# Patient Record
Sex: Male | Born: 1990 | Race: White | Hispanic: No | Marital: Single | Smoking: Never smoker
Health system: Southern US, Community
[De-identification: ages and names within clinical notes are randomized; demographics above are authoritative.]

---
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
## 2018-12-28 ENCOUNTER — Encounter (HOSPITAL_COMMUNITY): Payer: Self-pay

## 2018-12-28 ENCOUNTER — Emergency Department (HOSPITAL_COMMUNITY)
Admission: EM | Admit: 2018-12-28 | Discharge: 2018-12-28 | Disposition: A | Discharge status: Home / Self Care | Payer: Self-pay | Attending: Emergency Medicine | Admitting: Emergency Medicine

## 2018-12-28 ENCOUNTER — Other Ambulatory Visit: Payer: Self-pay

## 2018-12-28 DIAGNOSIS — R1115 Cyclical vomiting syndrome unrelated to migraine: Secondary | ICD-10-CM | POA: Insufficient documentation

## 2018-12-28 LAB — COMPREHENSIVE METABOLIC PANEL
ALT: 13 U/L (ref 0–44)
AST: 21 U/L (ref 15–41)
Albumin: 3.5 g/dL (ref 3.5–5.0)
Alkaline Phosphatase: 54 U/L (ref 38–126)
Anion gap: 13 (ref 5–15)
BUN: 12 mg/dL (ref 6–20)
CO2: 25 mmol/L (ref 22–32)
Calcium: 9.6 mg/dL (ref 8.9–10.3)
Chloride: 97 mmol/L — ABNORMAL LOW (ref 98–111)
Creatinine, Ser: 0.95 mg/dL (ref 0.61–1.24)
GFR calc Af Amer: >60 mL/min (ref >60)
GFR calc non Af Amer: >60 mL/min (ref >60)
Glucose, Bld: 126 mg/dL — ABNORMAL HIGH (ref 70–99)
Potassium: 3.7 mmol/L (ref 3.5–5.1)
Sodium: 135 mmol/L (ref 135–145)
Total Bilirubin: 1 mg/dL (ref 0.3–1.2)
Total Protein: 7.7 g/dL (ref 6.5–8.1)

## 2018-12-28 LAB — URINALYSIS, ROUTINE W REFLEX MICROSCOPIC
Bilirubin Urine: NEGATIVE
Glucose, UA: 50 mg/dL — AB
Hgb urine dipstick: NEGATIVE
Ketones, ur: 80 mg/dL — AB
Leukocytes,Ua: NEGATIVE
Nitrite: NEGATIVE
Protein, ur: 100 mg/dL — AB
Specific Gravity, Urine: 1.039 — ABNORMAL HIGH (ref 1.005–1.030)
pH: 5 (ref 5.0–8.0)

## 2018-12-28 LAB — CBC
HCT: 40.1 % (ref 39.0–52.0)
Hemoglobin: 13.8 g/dL (ref 13.0–17.0)
MCH: 30.1 pg (ref 26.0–34.0)
MCHC: 34.4 g/dL (ref 30.0–36.0)
MCV: 87.4 fL (ref 80.0–100.0)
Platelets: 343 10*3/uL (ref 150–400)
RBC: 4.59 MIL/uL (ref 4.22–5.81)
RDW: 12.1 % (ref 11.5–15.5)
WBC: 11.6 10*3/uL — ABNORMAL HIGH (ref 4.0–10.5)
nRBC: 0 % (ref 0.0–0.2)

## 2018-12-28 LAB — LIPASE, BLOOD: Lipase: 22 U/L (ref 11–51)

## 2018-12-28 MED ORDER — SODIUM CHLORIDE 0.9% FLUSH: 3.0000 mL | Freq: Once | INTRAVENOUS | Status: DC

## 2018-12-28 MED ORDER — HALOPERIDOL LACTATE 5 MG/ML IJ SOLN
2.0000 mg | Freq: Once | INTRAMUSCULAR | Status: AC
  Administered 2018-12-28: 2 mg via INTRAVENOUS
  Filled 2018-12-28: qty 1

## 2018-12-28 MED ORDER — ONDANSETRON 4 MG PO TBDP: 4.0000 mg | ORAL_TABLET | Freq: Three times a day (TID) | ORAL | 0 refills | Status: DC | PRN

## 2018-12-28 MED ORDER — LACTATED RINGERS IV BOLUS
1000.0000 mL | Freq: Once | INTRAVENOUS | Status: AC
  Administered 2018-12-28: 1000 mL via INTRAVENOUS

## 2018-12-28 MED ORDER — DIPHENHYDRAMINE HCL 50 MG/ML IJ SOLN
25.0000 mg | Freq: Once | INTRAMUSCULAR | Status: AC
  Administered 2018-12-28: 25 mg via INTRAVENOUS
  Filled 2018-12-28: qty 1

## 2018-12-28 NOTE — ED Notes (Signed)
Patient verbalizes understanding of discharge instructions. Opportunity for questioning and answers were provided. Armband removed by staff, pt discharged from ED.  

## 2018-12-28 NOTE — ED Triage Notes (Signed)
Patient complains of intermittent abdominal cramping with vomiting since Friday. Has been taking zofran with no relief. Denies diarrhea. States he is now vomiting bile

## 2018-12-28 NOTE — ED Provider Notes (Signed)
[facility] EMERGENCY DEPARTMENT Provider Note   CSN: [id number] Arrival date & time: 12/28/18  1657    History   Chief Complaint Chief Complaint  Patient presents with  . vomiting x 3 days    HPI Scott Santiago is a 28 y.o. male.  With past medical history of daily marijuana use who comes in with 3 days of nausea vomiting and epigastric pain.  Patient states he went to an urgent care 3 days before the given Zofran initially helped but is no longer working.  States he has had multiple episodes like this in the past.  States this has been going on for years but has increased in frequency over the past year.  States he has noticed that when he increases his marijuana use he is more likely to get these episodes.  Denies any fever, urinary symptoms, diarrhea or constipation.     HPI  History reviewed. No pertinent past medical history.  There are no active problems to display for this patient.   History reviewed. No pertinent surgical history.      Home Medications    Prior to Admission medications   Medication Sig Start Date End Date Taking? Authorizing Provider  ondansetron (ZOFRAN ODT) 4 MG disintegrating tablet Take 1 tablet (4 mg total) by mouth every 8 (eight) hours as needed for nausea or vomiting. 12/28/18   Dicky Doe, MD    Family History No family history on file.  Social History Social History   Tobacco Use  . Smoking status: Not on file  Substance Use Topics  . Alcohol use: Not on file  . Drug use: Not on file     Allergies   Patient has no known allergies.   Review of Systems Review of Systems  Constitutional: Positive for activity change and appetite change. Negative for chills, diaphoresis and fever.  HENT: Negative for ear pain.   Eyes: Negative for pain and visual disturbance.  Respiratory: Negative for cough and shortness of breath.   Cardiovascular: Negative for chest pain and palpitations.  Gastrointestinal: Positive  for abdominal pain, nausea and vomiting. Negative for constipation and diarrhea.  Genitourinary: Negative for dysuria.  Musculoskeletal: Negative for arthralgias and back pain.  Skin: Negative for color change and rash.  All other systems reviewed and are negative.    Physical Exam Updated Vital Signs BP 120/75   Pulse 71   Temp 98.3 F (36.8 C) (Oral)   Resp (!) 24   Ht 6\' 2"  (1.88 m)   Wt 77.1 kg   SpO2 97%   BMI 21.83 kg/m   Physical Exam Vitals signs and nursing note reviewed.  Constitutional:      General: He is not in acute distress.    Appearance: He is well-developed. He is ill-appearing.  HENT:     Head: Normocephalic and atraumatic.  Eyes:     Conjunctiva/sclera: Conjunctivae normal.  Neck:     Musculoskeletal: Neck supple.  Cardiovascular:     Rate and Rhythm: Normal rate and regular rhythm.     Heart sounds: No murmur.  Pulmonary:     Effort: Pulmonary effort is normal. No respiratory distress.     Breath sounds: Normal breath sounds.  Abdominal:     Palpations: Abdomen is soft.     Tenderness: There is abdominal tenderness (epigastric).  Musculoskeletal:     Right lower leg: No edema.     Left lower leg: No edema.  Skin:    General: Skin is  warm and dry.  Neurological:     Mental Status: He is alert and oriented to person, place, and time.     Gait: Gait normal.      ED Treatments / Results  Labs (all labs ordered are listed, but only abnormal results are displayed) Labs Reviewed  COMPREHENSIVE METABOLIC PANEL - Abnormal; Notable for the following components:      Result Value   Chloride 97 (*)    Glucose, Bld 126 (*)    All other components within normal limits  CBC - Abnormal; Notable for the following components:   WBC 11.6 (*)    All other components within normal limits  URINALYSIS, ROUTINE W REFLEX MICROSCOPIC - Abnormal; Notable for the following components:   Color, Urine AMBER (*)    Specific Gravity, Urine 1.039 (*)     Glucose, UA 50 (*)    Ketones, ur 80 (*)    Protein, ur 100 (*)    Bacteria, UA RARE (*)    All other components within normal limits  LIPASE, BLOOD    EKG EKG Interpretation  Date/Time:  Monday Dec 28 2018 20:25:40 EDT Ventricular Rate:  71 PR Interval:    QRS Duration: 110 QT Interval:  376 QTC Calculation: 409 R Axis:   97 Text Interpretation:  Sinus rhythm Short PR interval Consider right ventricular hypertrophy No old tracing to compare Confirmed by Pricilla Loveless [phone]) on 12/28/2018 9:10:39 PM   Radiology No results found.  Procedures Procedures (including critical care time)  Medications Ordered in ED Medications  sodium chloride flush (NS) 0.9 % injection 3 mL (has no administration in time range)  lactated ringers bolus 1,000 mL (0 mLs Intravenous Stopped 12/28/18 2151)  haloperidol lactate (HALDOL) injection 2 mg (2 mg Intravenous Given 12/28/18 2048)  diphenhydrAMINE (BENADRYL) injection 25 mg (25 mg Intravenous Given 12/28/18 2045)     Initial Impression / Assessment and Plan / ED Course  I have reviewed the triage vital signs and the nursing notes.  Pertinent labs & imaging results that were available during my care of the patient were reviewed by me and considered in my medical decision making (see chart for details).         Scott Santiago is a 28 y.o. male.  With past medical history of daily marijuana use who comes in with 3 days of nausea vomiting and epigastric pain.  On initial exam patient is ill-appearing but not in acute distress.  Vital signs are stable.  Labs unremarkable with a normal bicarb and lipase except for ketones in the urine.   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  EKG obtained that showed normal QTc.   Patient given 2 mg of Haldol, IV Benadryl, 1 L of LR.  On reevaluation patient states his nausea vomiting abdominal pain is completely relieved.  However he is endorsing some  mild akathisia.  States he feels much better though.  This time patient is stable for discharge home.  Counseled patient on marijuana cessation.  Advised him that if he continues to smoke marijuana he is likely to have return of symptoms.  Patient states understanding.  Patient discharged home in stable condition with a prescription for Zofran.   Final Clinical Impressions(s) / ED Diagnoses   Final diagnoses:  Cyclic vomiting syndrome    ED Discharge Orders         Ordered    ondansetron (ZOFRAN ODT) 4 MG disintegrating tablet  Every 8 hours  PRN     12/28/18 2136           Dicky DoeFord, Kalyan Barabas, MD 12/28/18 [id number]    Pricilla LovelessGoldston, Scott, MD 12/30/18 1309

## 2019-08-02 ENCOUNTER — Ambulatory Visit: Payer: Self-pay | Attending: Internal Medicine

## 2019-09-06 ENCOUNTER — Ambulatory Visit
Admission: EM | Admit: 2019-09-06 | Discharge: 2019-09-06 | Disposition: A | Discharge status: Home / Self Care | Payer: Self-pay | Attending: Physician Assistant | Admitting: Physician Assistant

## 2019-09-06 DIAGNOSIS — Z20822 Contact with and (suspected) exposure to covid-19: Secondary | ICD-10-CM

## 2019-09-06 DIAGNOSIS — J029 Acute pharyngitis, unspecified: Secondary | ICD-10-CM

## 2019-09-06 DIAGNOSIS — R52 Pain, unspecified: Secondary | ICD-10-CM

## 2019-09-06 DIAGNOSIS — R6883 Chills (without fever): Secondary | ICD-10-CM

## 2019-09-06 LAB — POCT RAPID STREP A (OFFICE): Rapid Strep A Screen: NEGATIVE

## 2019-09-06 MED ORDER — AZITHROMYCIN 500 MG PO TABS: 500.0000 mg | ORAL_TABLET | Freq: Every day | ORAL | 0 refills | Status: AC

## 2019-09-06 NOTE — ED Provider Notes (Signed)
[facility]    CSN: [id number] Arrival date & time: 09/06/19  8588      History   Chief Complaint Chief Complaint  Patient presents with  . Sore Throat    HPI Fount Bahe is a 29 y.o. male.   29 year old male comes in for 3 day of URI symptoms. Sore throat, swollen tonsil, body aches, chills. States taste is off. Denies fever. Denies abdominal pain, nausea, vomiting, diarrhea. Denies shortness of breath, loss of taste/smell. No sick contact. Gurgled salt water with some relief.      History reviewed. No pertinent past medical history.  There are no problems to display for this patient.   History reviewed. No pertinent surgical history.     Home Medications    Prior to Admission medications   Medication Sig Start Date End Date Taking? Authorizing Provider  azithromycin (ZITHROMAX) 500 MG tablet Take 1 tablet (500 mg total) by mouth daily for 5 days. Take first 2 tablets together, then 1 every day until finished. 09/06/19 09/11/19  Belinda Fisher, PA-C    Family History History reviewed. No pertinent family history.  Social History Social History   Tobacco Use  . Smoking status: Never Smoker  . Smokeless tobacco: Never Used  Substance Use Topics  . Alcohol use: Yes    Comment: occ  . Drug use: Never     Allergies   Amoxicillin   Review of Systems Review of Systems  Reason unable to perform ROS: See HPI as above.     Physical Exam Triage Vital Signs ED Triage Vitals [09/06/19 0950]  Enc Vitals Group     BP 134/85     Pulse Rate (!) 116     Resp 16     Temp 98.7 F (37.1 C)     Temp Source Oral     SpO2 97 %     Weight      Height      Head Circumference      Peak Flow      Pain Score 3     Pain Loc      Pain Edu?      Excl. in GC?    No data found.  Updated Vital Signs BP 134/85 (BP Location: Left Arm)   Pulse (!) 116   Temp 98.7 F (37.1 C) (Oral)   Resp 16   SpO2 97%   Physical Exam Constitutional:      General: He  is not in acute distress.    Appearance: Normal appearance. He is not ill-appearing, toxic-appearing or diaphoretic.  HENT:     Head: Normocephalic and atraumatic.     Mouth/Throat:     Mouth: Mucous membranes are moist.     Pharynx: Oropharynx is clear. Uvula midline.     Tonsils: 2+ on the right. 1+ on the left.     Comments: Exudates to the right tonsil Cardiovascular:     Rate and Rhythm: Normal rate and regular rhythm.     Heart sounds: Normal heart sounds. No murmur. No friction rub. No gallop.   Pulmonary:     Effort: Pulmonary effort is normal. No accessory muscle usage, prolonged expiration, respiratory distress or retractions.     Comments: Lungs clear to auscultation without adventitious lung sounds. Musculoskeletal:     Cervical back: Normal range of motion and neck supple.  Neurological:     General: No focal deficit present.     Mental Status: He is alert  and oriented to person, place, and time.      UC Treatments / Results  Labs (all labs ordered are listed, but only abnormal results are displayed) Labs Reviewed  CULTURE, GROUP A STREP ([place])  NOVEL CORONAVIRUS, NAA  POCT RAPID STREP A (OFFICE)    EKG   Radiology No results found.  Procedures Procedures (including critical care time)  Medications Ordered in UC Medications - No data to display  Initial Impression / Assessment and Plan / UC Course  I have reviewed the triage vital signs and the nursing notes.  Pertinent labs & imaging results that were available during my care of the patient were reviewed by me and considered in my medical decision making (see chart for details).     Rapid strep negative.  However, given history and exam, will cover for tonsillitis with azithromycin. Discussed cannot rule out COVID, will have patient quarantine while pending COVID results.  Other symptomatic treatment discussed.  Return precautions given.  Patient expresses understanding and agrees to plan.  Final  Clinical Impressions(s) / UC Diagnoses   Final diagnoses:  Sore throat  Body aches  Chills   ED Prescriptions    Medication Sig Dispense Auth. Provider   azithromycin (ZITHROMAX) 500 MG tablet Take 1 tablet (500 mg total) by mouth daily for 5 days. Take first 2 tablets together, then 1 every day until finished. 5 tablet Ok Edwards, PA-C     PDMP not reviewed this encounter.   Ok Edwards, PA-C 09/06/19 1020

## 2019-09-06 NOTE — Discharge Instructions (Signed)
Rapid strep negative. However, given your exam, will cover you empirically for bacterial infection with azithromycin. As discussed, symptoms can still be due to viral illness/drainage to the throat. COVID testing ordered. Please quarantine until testing results return. Monitor for any worsening of symptoms, swelling of the throat, trouble breathing, trouble swallowing, leaning forward to breath, drooling, go to the emergency department for further evaluation needed.

## 2019-09-06 NOTE — ED Triage Notes (Signed)
Pt c/o sore throat with swollen tonsils and white patches since saturday

## 2019-09-07 LAB — NOVEL CORONAVIRUS, NAA: SARS-CoV-2, NAA: NOT DETECTED

## 2019-09-08 ENCOUNTER — Ambulatory Visit
Admission: EM | Admit: 2019-09-08 | Discharge: 2019-09-08 | Disposition: A | Discharge status: Home / Self Care | Payer: Self-pay | Attending: Emergency Medicine | Admitting: Emergency Medicine

## 2019-09-08 DIAGNOSIS — Z113 Encounter for screening for infections with a predominantly sexual mode of transmission: Secondary | ICD-10-CM | POA: Insufficient documentation

## 2019-09-08 DIAGNOSIS — Z7251 High risk heterosexual behavior: Secondary | ICD-10-CM | POA: Insufficient documentation

## 2019-09-08 DIAGNOSIS — Z114 Encounter for screening for human immunodeficiency virus [HIV]: Secondary | ICD-10-CM | POA: Insufficient documentation

## 2019-09-08 DIAGNOSIS — J039 Acute tonsillitis, unspecified: Secondary | ICD-10-CM | POA: Insufficient documentation

## 2019-09-08 LAB — CULTURE, GROUP A STREP (THRC)

## 2019-09-08 MED ORDER — METHYLPREDNISOLONE SODIUM SUCC 125 MG IJ SOLR
125.0000 mg | Freq: Once | INTRAMUSCULAR | Status: AC
  Administered 2019-09-08: 09:00:00 125 mg via INTRAMUSCULAR

## 2019-09-08 MED ORDER — AZITHROMYCIN 250 MG PO TABS
250.0000 mg | ORAL_TABLET | Freq: Once | ORAL | Status: AC
  Administered 2019-09-08: 250 mg via ORAL

## 2019-09-08 MED ORDER — DOXYCYCLINE HYCLATE 100 MG PO CAPS: 100.0000 mg | ORAL_CAPSULE | Freq: Two times a day (BID) | ORAL | 0 refills | Status: DC

## 2019-09-08 NOTE — ED Provider Notes (Signed)
[facility]    CSN: [id number] Arrival date & time: 09/08/19  0850      History   Chief Complaint Chief Complaint  Patient presents with  . Sore Throat    HPI Scott Santiago is a 29 y.o. male sending for persistent sore throat, swollen tonsils.  Patient initially seen 2/1: Negative rapid strep test, culture came back negative.  Covid test still pending.  Patient has been compliant with azithromycin as prescribed and states his left tonsil has not started swelling, right is persisting.  Patient also has headache and fever.  Denies drooling, difficulties breathing, change in voice quality, chest pain, palpitations, shortness of breath.  Patient is currently sexually active with 1 male partner.  Uses condoms, though does perform oral sex without barrier method.  Unsure of current partner's status.  Denies history of HIV, syphilis.   History reviewed. No pertinent past medical history.  There are no problems to display for this patient.   History reviewed. No pertinent surgical history.     Home Medications    Prior to Admission medications   Medication Sig Start Date End Date Taking? Authorizing Provider  azithromycin (ZITHROMAX) 500 MG tablet Take 1 tablet (500 mg total) by mouth daily for 5 days. Take first 2 tablets together, then 1 every day until finished. 09/06/19 09/11/19  Belinda Fisher, PA-C  doxycycline (VIBRAMYCIN) 100 MG capsule Take 1 capsule (100 mg total) by mouth 2 (two) times daily. 09/08/19   Hall-Potvin, [place], PA-C    Family History History reviewed. No pertinent family history.  Social History Social History   Tobacco Use  . Smoking status: Never Smoker  . Smokeless tobacco: Never Used  Substance Use Topics  . Alcohol use: Yes    Comment: occ  . Drug use: Never     Allergies   Amoxicillin   Review of Systems As per HPI   Physical Exam Triage Vital Signs ED Triage Vitals [09/08/19 0902]  Enc Vitals Group     BP (!) 138/96   Pulse Rate (!) 112     Resp 16     Temp 99 F (37.2 C)     Temp Source Oral     SpO2 97 %     Weight      Height      Head Circumference      Peak Flow      Pain Score      Pain Loc      Pain Edu?      Excl. in GC?    No data found.  Updated Vital Signs BP (!) 138/96 (BP Location: Left Arm)   Pulse (!) 112   Temp 99 F (37.2 C) (Oral)   Resp 16   SpO2 97%   Visual Acuity Right Eye Distance:   Left Eye Distance:   Bilateral Distance:    Right Eye Near:   Left Eye Near:    Bilateral Near:     Physical Exam Constitutional:      General: He is not in acute distress.    Appearance: He is well-developed. He is not toxic-appearing or diaphoretic.  HENT:     Head: Normocephalic and atraumatic.     Right Ear: Tympanic membrane, ear canal and external ear normal.     Left Ear: Tympanic membrane, ear canal and external ear normal.     Nose: No nasal deformity, congestion or rhinorrhea.     Mouth/Throat:     Mouth: Mucous membranes  are moist.     Tongue: Tongue does not deviate from midline.     Pharynx: Uvula midline. No uvula swelling.     Tonsils: Tonsillar exudate present. 3+ on the right. 2+ on the left.  Eyes:     General: No scleral icterus.    Conjunctiva/sclera: Conjunctivae normal.     Pupils: Pupils are equal, round, and reactive to light.  Neck:     Thyroid: No thyromegaly.     Comments: R>L LAD Cardiovascular:     Rate and Rhythm: Normal rate and regular rhythm.     Heart sounds: No murmur. No gallop.   Pulmonary:     Effort: Pulmonary effort is normal. No respiratory distress.     Breath sounds: No stridor. No wheezing, rhonchi or rales.  Musculoskeletal:     Cervical back: Normal range of motion and neck supple. No muscular tenderness.  Lymphadenopathy:     Cervical: Cervical adenopathy present.  Skin:    General: Skin is warm.     Capillary Refill: Capillary refill takes less than 2 seconds.     Findings: No erythema or rash.  Neurological:      General: No focal deficit present.     Mental Status: He is alert.  Psychiatric:        Mood and Affect: Mood normal.        Behavior: Behavior normal.      UC Treatments / Results  Labs (all labs ordered are listed, but only abnormal results are displayed) Labs Reviewed  HIV ANTIBODY (ROUTINE TESTING W REFLEX)   Narrative:    Performed at:  [address] [place] [address] [place], [place]  [id number] Lab Director: Jolene Schimke MD, Phone:  [phone]  [facility]   Narrative:    Performed at:  [address], [place], [place]  [id number] Lab Director: Jolene Schimke MD, Phone:  [phone]  CYTOLOGY, (ORAL, ANAL, URETHRAL) ANCILLARY ONLY    EKG   Radiology No results found.  Procedures Procedures (including critical care time)  Medications Ordered in UC Medications  methylPREDNISolone sodium succinate (SOLU-MEDROL) 125 mg/2 mL injection 125 mg (125 mg Intramuscular Given 09/08/19 0927)  azithromycin (ZITHROMAX) tablet 250 mg (250 mg Oral Given 09/08/19 0926)    Initial Impression / Assessment and Plan / UC Course  I have reviewed the triage vital signs and the nursing notes.  Pertinent labs & imaging results that were available during my care of the patient were reviewed by me and considered in my medical decision making (see chart for details).     Patient afebrile, nontoxic in office today.  Temperature 91F without antipyretic/NSAID and airways are not compromised at this time.  Patient states only 5 tabs of azithromycin were dispensed on last prescription: Given 250 mg in office today as patient has not yet taken his daily dose: We will finish 5-day course to completion thereafter.  Will start doxycycline empirically for additional antibiotic coverage/possible gonococcal infection while cytology is pending.  Could also consider clindamycin if cytology is negative and patient develops fevers w/ persistent pain/swelling.  Return precautions  discussed, patient verbalized understanding and is agreeable to plan. Final Clinical Impressions(s) / UC Diagnoses   Final diagnoses:  Screening for human immunodeficiency virus  Screening examination for venereal disease  Acute tonsillitis, unspecified etiology  Unprotected sex     Discharge Instructions     Take antibiotic as prescribed. Please finish your azithromycin dosing tomorrow the next day. You are given  steroid shot today to help with inflammation: May use Tylenol, ice packs as well. STD testing pending: We will call you if positive and needing further treatment. Please go to ER for worsening throat pain, fever, difficulty breathing or swallowing, drooling, chest pain.    ED Prescriptions    Medication Sig Dispense Auth. Provider   doxycycline (VIBRAMYCIN) 100 MG capsule Take 1 capsule (100 mg total) by mouth 2 (two) times daily. 20 capsule Hall-Potvin, [place], PA-C     PDMP not reviewed this encounter.   Hall-Potvin, [place], [place] 09/09/19 1520

## 2019-09-08 NOTE — Discharge Instructions (Addendum)
Take antibiotic as prescribed. Please finish your azithromycin dosing tomorrow the next day. You are given steroid shot today to help with inflammation: May use Tylenol, ice packs as well. STD testing pending: We will call you if positive and needing further treatment. Please go to ER for worsening throat pain, fever, difficulty breathing or swallowing, drooling, chest pain.

## 2019-09-08 NOTE — ED Triage Notes (Signed)
Pt states seen and tx'd on Monday for sore throat and swollen tonsils. States lt tonsil is now swollen with white patches. Pt states now having a headache and fever.

## 2019-09-09 LAB — HIV ANTIBODY (ROUTINE TESTING W REFLEX): HIV Screen 4th Generation wRfx: NONREACTIVE

## 2019-09-09 LAB — CYTOLOGY, (ORAL, ANAL, URETHRAL) ANCILLARY ONLY
Chlamydia: NEGATIVE
Neisseria Gonorrhea: NEGATIVE
Trichomonas: NEGATIVE

## 2019-09-09 LAB — RPR: RPR Ser Ql: NONREACTIVE

## 2019-09-10 ENCOUNTER — Telehealth: Payer: Self-pay | Admitting: Emergency Medicine

## 2019-09-10 NOTE — Telephone Encounter (Signed)
Received a call from patient stating someone tried to call him yesterday and he was returning this call.  This RN looked through chart with APP, and returned patient's call.  Discussed current treatments, available test results, and discussed symptoms.  Patient still complains of some sore throat, but states his other symptoms have improved.  This RN encouraged him to finish antibiotics and continue to monitor until COVID results come back, and recommended ENT follow up if symptoms become concerning in the meantime.  Patient verbalized understanding.

## 2019-12-19 ENCOUNTER — Encounter (HOSPITAL_COMMUNITY): Payer: Self-pay | Admitting: Emergency Medicine

## 2019-12-19 ENCOUNTER — Emergency Department (HOSPITAL_COMMUNITY)
Admission: EM | Admit: 2019-12-19 | Discharge: 2019-12-19 | Disposition: A | Discharge status: Home / Self Care | Payer: Self-pay | Attending: Emergency Medicine | Admitting: Emergency Medicine

## 2019-12-19 ENCOUNTER — Emergency Department (HOSPITAL_COMMUNITY): Payer: Self-pay

## 2019-12-19 DIAGNOSIS — R1084 Generalized abdominal pain: Secondary | ICD-10-CM | POA: Insufficient documentation

## 2019-12-19 DIAGNOSIS — R195 Other fecal abnormalities: Secondary | ICD-10-CM | POA: Insufficient documentation

## 2019-12-19 DIAGNOSIS — Z20822 Contact with and (suspected) exposure to covid-19: Secondary | ICD-10-CM | POA: Insufficient documentation

## 2019-12-19 DIAGNOSIS — J189 Pneumonia, unspecified organism: Secondary | ICD-10-CM | POA: Insufficient documentation

## 2019-12-19 LAB — CBC
HCT: 46.2 % (ref 39.0–52.0)
Hemoglobin: 15.9 g/dL (ref 13.0–17.0)
MCH: 30.3 pg (ref 26.0–34.0)
MCHC: 34.4 g/dL (ref 30.0–36.0)
MCV: 88.2 fL (ref 80.0–100.0)
Platelets: 332 10*3/uL (ref 150–400)
RBC: 5.24 MIL/uL (ref 4.22–5.81)
RDW: 11.3 % — ABNORMAL LOW (ref 11.5–15.5)
WBC: 12.5 10*3/uL — ABNORMAL HIGH (ref 4.0–10.5)
nRBC: 0 % (ref 0.0–0.2)

## 2019-12-19 LAB — COMPREHENSIVE METABOLIC PANEL
ALT: 12 U/L (ref 0–44)
AST: 18 U/L (ref 15–41)
Albumin: 3.9 g/dL (ref 3.5–5.0)
Alkaline Phosphatase: 60 U/L (ref 38–126)
Anion gap: 11 (ref 5–15)
BUN: 6 mg/dL (ref 6–20)
CO2: 25 mmol/L (ref 22–32)
Calcium: 9.6 mg/dL (ref 8.9–10.3)
Chloride: 97 mmol/L — ABNORMAL LOW (ref 98–111)
Creatinine, Ser: 0.9 mg/dL (ref 0.61–1.24)
GFR calc Af Amer: >60 mL/min (ref >60)
GFR calc non Af Amer: >60 mL/min (ref >60)
Glucose, Bld: 122 mg/dL — ABNORMAL HIGH (ref 70–99)
Potassium: 3.9 mmol/L (ref 3.5–5.1)
Sodium: 133 mmol/L — ABNORMAL LOW (ref 135–145)
Total Bilirubin: 1.2 mg/dL (ref 0.3–1.2)
Total Protein: 7.9 g/dL (ref 6.5–8.1)

## 2019-12-19 LAB — POC OCCULT BLOOD, ED: Fecal Occult Bld: NEGATIVE

## 2019-12-19 LAB — URINALYSIS, ROUTINE W REFLEX MICROSCOPIC
Bilirubin Urine: NEGATIVE
Glucose, UA: NEGATIVE mg/dL
Hgb urine dipstick: NEGATIVE
Ketones, ur: 20 mg/dL — AB
Leukocytes,Ua: NEGATIVE
Nitrite: NEGATIVE
Protein, ur: NEGATIVE mg/dL
Specific Gravity, Urine: 1.026 (ref 1.005–1.030)
pH: 7 (ref 5.0–8.0)

## 2019-12-19 LAB — SARS CORONAVIRUS 2 (TAT 6-24 HRS): SARS Coronavirus 2: NEGATIVE

## 2019-12-19 LAB — LIPASE, BLOOD: Lipase: 19 U/L (ref 11–51)

## 2019-12-19 MED ORDER — SODIUM CHLORIDE 0.9% FLUSH: 3.0000 mL | Freq: Once | INTRAVENOUS | Status: DC

## 2019-12-19 MED ORDER — DOXYCYCLINE HYCLATE 100 MG PO CAPS: 100.0000 mg | ORAL_CAPSULE | Freq: Two times a day (BID) | ORAL | 0 refills | Status: DC

## 2019-12-19 MED ORDER — DOXYCYCLINE HYCLATE 100 MG PO TABS
100.0000 mg | ORAL_TABLET | Freq: Once | ORAL | Status: AC
  Administered 2019-12-19: 100 mg via ORAL
  Filled 2019-12-19: qty 1

## 2019-12-19 MED ORDER — IOHEXOL 300 MG/ML  SOLN
100.0000 mL | Freq: Once | INTRAMUSCULAR | Status: AC | PRN
  Administered 2019-12-19: 100 mL via INTRAVENOUS

## 2019-12-19 NOTE — ED Notes (Signed)
Patient verbalizes understanding of discharge instructions. Opportunity for questioning and answers were provided. Armband removed by staff, pt discharged from ED ambulatory.   

## 2019-12-19 NOTE — ED Provider Notes (Signed)
[facility] EMERGENCY DEPARTMENT Provider Note   CSN: [id number] Arrival date & time: 12/19/19  1027     History Chief Complaint  Patient presents with  . Abdominal Pain    Scott Santiago is a 29 y.o. male.  The history is provided by the patient. No language interpreter was used.  Abdominal Pain Pain location:  Generalized Pain quality: aching   Pain radiates to:  Does not radiate Pain severity:  Moderate Onset quality:  Gradual Timing:  Constant Progression:  Worsening Chronicity:  New Context: not sick contacts   Relieved by:  Nothing Worsened by:  Nothing Ineffective treatments:  None tried Risk factors: no recent hospitalization   Pt complains of right sided abdominal pain.  Pt reports he has had some black stool.  Pt reports formed stool. No diarrhea no vomiting.      History reviewed. No pertinent past medical history.  There are no problems to display for this patient.   History reviewed. No pertinent surgical history.     No family history on file.  Social History   Tobacco Use  . Smoking status: Never Smoker  . Smokeless tobacco: Never Used  Substance Use Topics  . Alcohol use: Yes    Comment: occ  . Drug use: Never    Home Medications Prior to Admission medications   Medication Sig Start Date End Date Taking? Authorizing Provider  doxycycline (VIBRAMYCIN) 100 MG capsule Take 1 capsule (100 mg total) by mouth 2 (two) times daily. 12/19/19   Fransico Meadow, PA-C    Allergies    Amoxicillin and Lorabid [loracarbef]  Review of Systems   Review of Systems  Gastrointestinal: Positive for abdominal pain.  All other systems reviewed and are negative.   Physical Exam Updated Vital Signs BP 102/88   Pulse 94   Temp 97.9 F (36.6 C) (Oral)   Resp 16   Ht 6\' 2"  (1.88 m)   Wt 77.1 kg   SpO2 93%   BMI 21.83 kg/m   Physical Exam Vitals and nursing note reviewed.  Constitutional:      Appearance: He is well-developed.   HENT:     Head: Normocephalic and atraumatic.  Eyes:     Conjunctiva/sclera: Conjunctivae normal.  Cardiovascular:     Rate and Rhythm: Normal rate and regular rhythm.     Heart sounds: No murmur.  Pulmonary:     Effort: Pulmonary effort is normal. No respiratory distress.     Breath sounds: Normal breath sounds.  Abdominal:     General: Abdomen is flat.     Palpations: Abdomen is soft.     Tenderness: There is abdominal tenderness.  Musculoskeletal:     Cervical back: Neck supple.  Skin:    General: Skin is warm and dry.  Neurological:     General: No focal deficit present.     Mental Status: He is alert.  Psychiatric:        Mood and Affect: Mood normal.     ED Results / Procedures / Treatments   Labs (all labs ordered are listed, but only abnormal results are displayed) Labs Reviewed  COMPREHENSIVE METABOLIC PANEL - Abnormal; Notable for the following components:      Result Value   Sodium 133 (*)    Chloride 97 (*)    Glucose, Bld 122 (*)    All other components within normal limits  CBC - Abnormal; Notable for the following components:   WBC 12.5 (*)  RDW 11.3 (*)    All other components within normal limits  URINALYSIS, ROUTINE W REFLEX MICROSCOPIC - Abnormal; Notable for the following components:   Ketones, ur 20 (*)    All other components within normal limits  SARS CORONAVIRUS 2 (TAT 6-24 HRS)  LIPASE, BLOOD  POC OCCULT BLOOD, ED    EKG None  Radiology CT ABDOMEN PELVIS W CONTRAST  Result Date: 12/19/2019 CLINICAL DATA:  Pt reports abd pain that is most on the RLQ, also endorses some dark stools over the past few days. Fever of 101 at home. EXAM: CT ABDOMEN AND PELVIS WITH CONTRAST TECHNIQUE: Multidetector CT imaging of the abdomen and pelvis was performed using the standard protocol following bolus administration of intravenous contrast. CONTRAST:  OMNIPAQUE IOHEXOL 300 MG/ML  SOLN COMPARISON:  None. FINDINGS: Lower chest: Subtle ground-glass  opacities noted in the lung bases. Heart normal in size. No pleural effusion Hepatobiliary: No focal liver abnormality is seen. No gallstones, gallbladder wall thickening, or biliary dilatation. Pancreas: Unremarkable. No pancreatic ductal dilatation or surrounding inflammatory changes. Spleen: Normal in size without focal abnormality. Adrenals/Urinary Tract: Adrenal glands are unremarkable. Kidneys are normal, without renal calculi, focal lesion, or hydronephrosis. Bladder is unremarkable. Stomach/Bowel: Stomach is within normal limits. Appendix appears normal. No evidence of bowel wall thickening, distention, or inflammatory changes. Vascular/Lymphatic: No significant vascular findings are present. No enlarged abdominal or pelvic lymph nodes. Reproductive: Unremarkable. Other: No abdominal wall hernia or abnormality. No abdominopelvic ascites. Musculoskeletal: No acute or significant osseous findings. IMPRESSION: 1. No abnormality within the abdomen or pelvis. Normal appendix visualized. No findings to account for right lower quadrant abdominal pain. 2. Subtle ground-glass opacities at the lung bases that seen bilaterally and fairly symmetrically. This could reflect infection or inflammation. It could reflect mosaic attenuation related to airways disease. It is nonspecific. Electronically Signed   By: Amie Portland M.D.   On: 12/19/2019 13:53   DG Chest Port 1 View  Result Date: 12/19/2019 CLINICAL DATA:  Cough. EXAM: PORTABLE CHEST 1 VIEW COMPARISON:  None. FINDINGS: Cardiac silhouette is normal in size. Normal mediastinal and hilar contours. Lungs are hyperexpanded, but clear. No pleural effusion or pneumothorax. Skeletal structures are grossly intact. IMPRESSION: No active disease. Electronically Signed   By: Amie Portland M.D.   On: 12/19/2019 15:09    Procedures Procedures (including critical care time)  Medications Ordered in ED Medications  sodium chloride flush (NS) 0.9 % injection 3 mL (has no  administration in time range)  iohexol (OMNIPAQUE) 300 MG/ML solution 100 mL (100 mLs Intravenous Contrast Given 12/19/19 1328)  doxycycline (VIBRA-TABS) tablet 100 mg (100 mg Oral Given 12/19/19 1549)    ED Course  I have reviewed the triage vital signs and the nursing notes.  Pertinent labs & imaging results that were available during my care of the patient were reviewed by me and considered in my medical decision making (see chart for details).    MDM Rules/Calculators/A&P                      MDM:  Pt has elevated wbc count to 12. Ct abdomen shows possible infection bilat lower lungs.  Portable chest shows no sign of pneumonia  Final Clinical Impression(s) / ED Diagnoses Final diagnoses:  Generalized abdominal pain  Community acquired pneumonia, unspecified laterality    Rx / DC Orders ED Discharge Orders         Ordered    doxycycline (VIBRAMYCIN) 100 MG capsule  2 times daily     12/19/19 1547        An After Visit Summary was printed and given to the patient.    Elson Areas, [place] 12/19/19 1710    Terald Sleeper, MD 12/19/19 [phone]

## 2019-12-19 NOTE — ED Triage Notes (Signed)
Pt reports abd pain that is most on the RLQ, also endorses some dark stools over the past few days. Fever of 101 at home.

## 2019-12-19 NOTE — Discharge Instructions (Signed)
Return if any problems.  Your covid is pending 

## 2020-09-08 ENCOUNTER — Other Ambulatory Visit: Payer: Self-pay

## 2020-09-08 ENCOUNTER — Ambulatory Visit
Admission: EM | Admit: 2020-09-08 | Discharge: 2020-09-08 | Disposition: A | Discharge status: Home / Self Care | Payer: Self-pay | Attending: Emergency Medicine | Admitting: Emergency Medicine

## 2020-09-08 DIAGNOSIS — S61412A Laceration without foreign body of left hand, initial encounter: Secondary | ICD-10-CM

## 2020-09-08 MED ORDER — TETANUS-DIPHTH-ACELL PERTUSSIS 5-2.5-18.5 LF-MCG/0.5 IM SUSY
0.5000 mL | PREFILLED_SYRINGE | Freq: Once | INTRAMUSCULAR | Status: AC
  Administered 2020-09-08: 0.5 mL via INTRAMUSCULAR

## 2020-09-08 NOTE — Discharge Instructions (Signed)

## 2020-09-08 NOTE — ED Provider Notes (Signed)
[facility]    CSN: [id number] Arrival date & time: 09/08/20  1640      History   Chief Complaint Chief Complaint  Patient presents with  . Laceration    Occurred 1 hour ago    HPI Scott Santiago is a 30 y.o. male presenting today for evaluation of laceration.  Approximately 1 hour ago accidentally cut left hand between second and third finger on a metal piece underneath the desk.  Denies any difficulty bit bending fingers since.  Last tetanus: Unknown    HPI  History reviewed. No pertinent past medical history.  There are no problems to display for this patient.   History reviewed. No pertinent surgical history.     Home Medications    Prior to Admission medications   Not on File    Family History History reviewed. No pertinent family history.  Social History Social History   Tobacco Use  . Smoking status: Never Smoker  . Smokeless tobacco: Never Used  Vaping Use  . Vaping Use: Never used  Substance Use Topics  . Alcohol use: Yes    Comment: occ  . Drug use: Never     Allergies   Amoxicillin and Lorabid [loracarbef]   Review of Systems Review of Systems  Constitutional: Negative for fatigue and fever.  Eyes: Negative for redness, itching and visual disturbance.  Respiratory: Negative for shortness of breath.   Cardiovascular: Negative for chest pain and leg swelling.  Gastrointestinal: Negative for nausea and vomiting.  Musculoskeletal: Negative for arthralgias and myalgias.  Skin: Positive for color change and wound. Negative for rash.  Neurological: Negative for dizziness, syncope, weakness, light-headedness and headaches.     Physical Exam Triage Vital Signs ED Triage Vitals  Enc Vitals Group     BP      Pulse      Resp      Temp      Temp src      SpO2      Weight      Height      Head Circumference      Peak Flow      Pain Score      Pain Loc      Pain Edu?      Excl. in GC?    No data found.  Updated  Vital Signs BP (!) 154/79 (BP Location: Left Arm)   Pulse 61   Temp 98.3 F (36.8 C) (Oral)   Resp 16   SpO2 97%   Visual Acuity Right Eye Distance:   Left Eye Distance:   Bilateral Distance:    Right Eye Near:   Left Eye Near:    Bilateral Near:     Physical Exam Vitals and nursing note reviewed.  Constitutional:      Appearance: He is well-developed and well-nourished.     Comments: No acute distress  HENT:     Head: Normocephalic and atraumatic.     Nose: Nose normal.  Eyes:     Conjunctiva/sclera: Conjunctivae normal.  Cardiovascular:     Rate and Rhythm: Normal rate.  Pulmonary:     Effort: Pulmonary effort is normal. No respiratory distress.  Abdominal:     General: There is no distension.  Musculoskeletal:        General: Normal range of motion.     Cervical back: Neck supple.     Comments: Full active range of motion of the second and third fingers Radial pulse 2+ Sensation intact  distally  Skin:    General: Skin is warm and dry.     Comments: Webbing between second and third fingers on dorsum of left hand with approximately 2 cm laceration, tendon visualized below, but appears intact, distal portion with center pieces skin and slightly jagged  Neurological:     Mental Status: He is alert and oriented to person, place, and time.  Psychiatric:        Mood and Affect: Mood and affect normal.      UC Treatments / Results  Labs (all labs ordered are listed, but only abnormal results are displayed) Labs Reviewed - No data to display  EKG   Radiology No results found.  Procedures Laceration Repair  Date/Time: 09/08/2020 5:48 PM Performed by: Kinsler Soeder, [place] C, PA-C Authorized by: Elita Dame, Clarence C, PA-C   Consent:    Consent obtained:  Verbal   Consent given by:  Patient   Risks, benefits, and alternatives were discussed: yes     Risks discussed:  Infection, pain, poor cosmetic result and tendon damage   Alternatives discussed:  No  treatment Universal protocol:    Patient identity confirmed:  Verbally with patient Anesthesia:    Anesthesia method:  Local infiltration   Local anesthetic:  Lidocaine 2% w/o epi Laceration details:    Location:  Hand   Hand location:  L hand, dorsum   Length (cm):  2 Pre-procedure details:    Preparation:  Patient was prepped and draped in usual sterile fashion Exploration:    Limited defect created (wound extended): yes     Hemostasis achieved with:  Direct pressure   Imaging outcome: foreign body not noted     Wound exploration: wound explored through full range of motion     Contaminated: no   Treatment:    Area cleansed with:  Shur-Clens   Amount of cleaning:  Standard   Irrigation solution:  Sterile water   Irrigation method:  Syringe   Visualized foreign bodies/material removed: no     Debridement:  None Skin repair:    Repair method:  Sutures and tissue adhesive   Suture size:  4-0   Suture material:  Prolene   Suture technique:  Simple interrupted   Number of sutures:  2 Approximation:    Approximation:  Close Repair type:    Repair type:  Simple Post-procedure details:    Dressing:  Non-adherent dressing   Procedure completion:  Tolerated well, no immediate complications   (including critical care time)  Medications Ordered in UC Medications  Tdap (BOOSTRIX) injection 0.5 mL (has no administration in time range)    Initial Impression / Assessment and Plan / UC Course  I have reviewed the triage vital signs and the nursing notes.  Pertinent labs & imaging results that were available during my care of the patient were reviewed by me and considered in my medical decision making (see chart for details).    Laceration repaired with sutures and Dermabond, tetanus updated, no signs of tendon involvement at this time or underlying fracture.  Monitor for any signs of infection, keep wound clean and dry.  Follow-up for suture removal in approximately 1  week.  Discussed strict return precautions. Patient verbalized understanding and is agreeable with plan.   Final Clinical Impressions(s) / UC Diagnoses   Final diagnoses:  Laceration of left hand without foreign body, initial encounter     Discharge Instructions     WOUND CARE Please return in 7 days to have your stitches/staples  removed or sooner if you have concerns. Marland Kitchen Keep area clean and dry for 24 hours. Do not remove bandage, if applied. . After 24 hours, remove bandage and wash wound gently with mild soap and warm water. Reapply a new bandage after cleaning wound, if directed. . Continue daily cleansing with soap and water until stitches/staples are removed. . Do not apply any ointments or creams to the wound while stitches/staples are in place, as this may cause delayed healing. . Notify the office if you experience any of the following signs of infection: Swelling, redness, pus drainage, streaking, fever >101.0 F . Notify the office if you experience excessive bleeding that does not stop after 15-20 minutes of constant, firm pressure.    ED Prescriptions    None     PDMP not reviewed this encounter.   Lew Dawes, [place] 09/08/20 1750

## 2020-09-08 NOTE — ED Triage Notes (Signed)
Patient states he was talking and swung his hand and his a piece of metal causing a laceration between his left pointer and middle fingers. Pt presents with it wrapped and some blood showing thru the dressing. Pt is aox4 and ambulatory.

## 2020-12-23 IMAGING — CT CT ABD-PELV W/ CM
2 of 4 series · 16 of 46 positions shown, 18 images · IV contrast (Omni 300)
Comparison: None.

CLINICAL DATA: Pt reports abd pain that is most on the RLQ, also
endorses some dark stools over the past few days. Fever of 101 at
home.

EXAM:
CT ABDOMEN AND PELVIS WITH CONTRAST
TECHNIQUE: Multidetector CT imaging of the abdomen and pelvis was performed
using the standard protocol following bolus administration of
intravenous contrast.
CONTRAST:  100mL OMNIPAQUE IOHEXOL 300 MG/ML  SOLN

[Series 3: a/p w/ 5mm · axial · 0.73mm/px · z∈[-517,-97]mm · 13 of 92 slices shown, 15 images]
[im 4/92  soft-tissue]
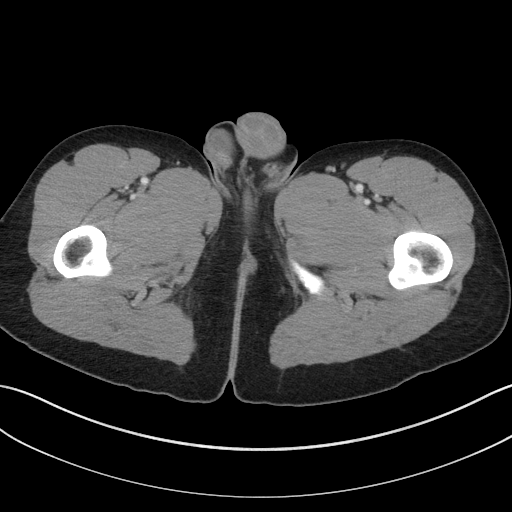
[im 4/92  bone]
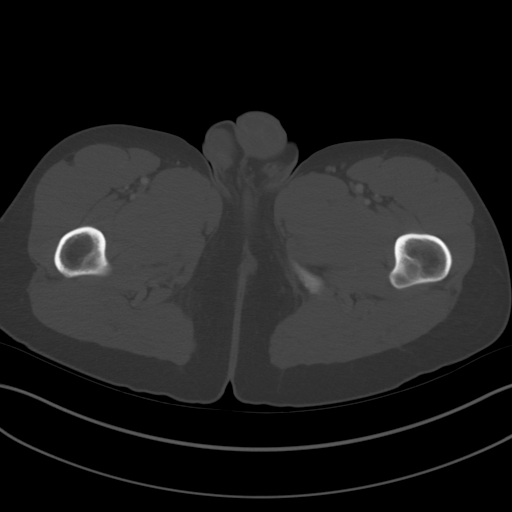
[im 12/92  soft-tissue]
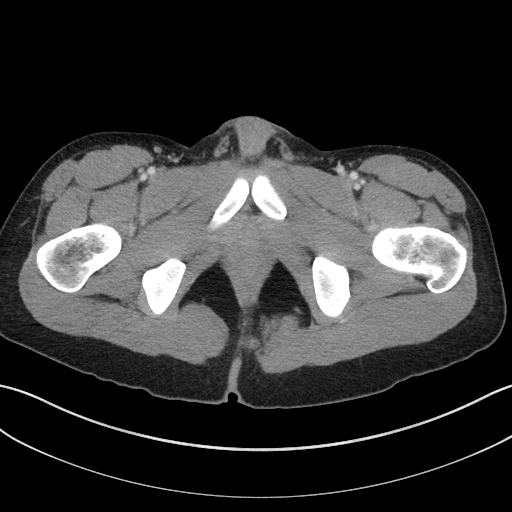
[im 20/92  soft-tissue]
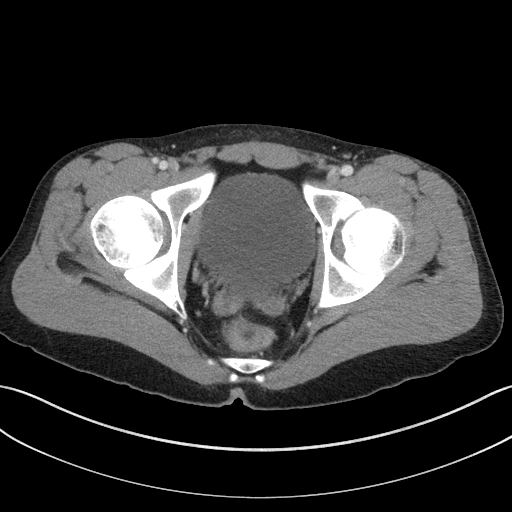
[im 24/92  soft-tissue]
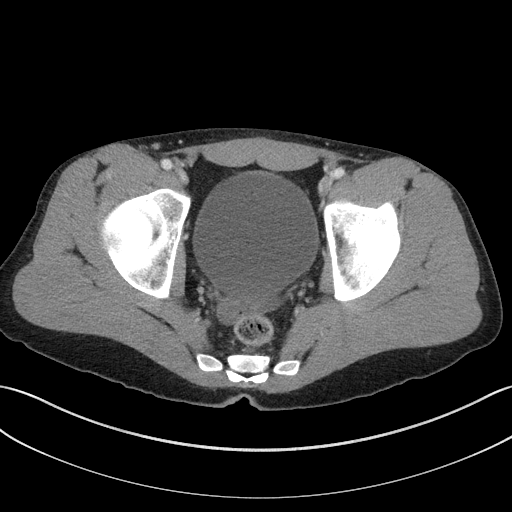
[im 32/92  soft-tissue]
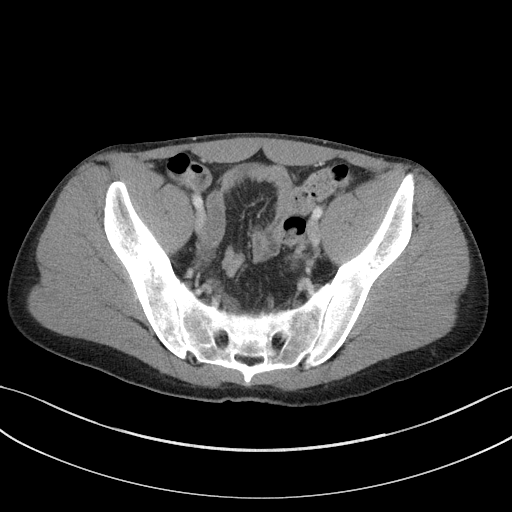
[im 40/92  soft-tissue]
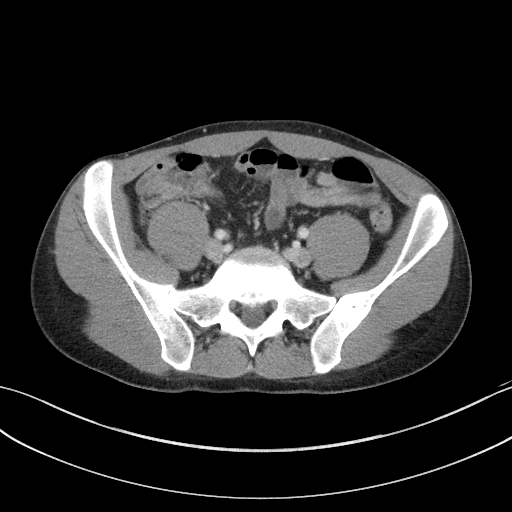
[im 48/92  soft-tissue]
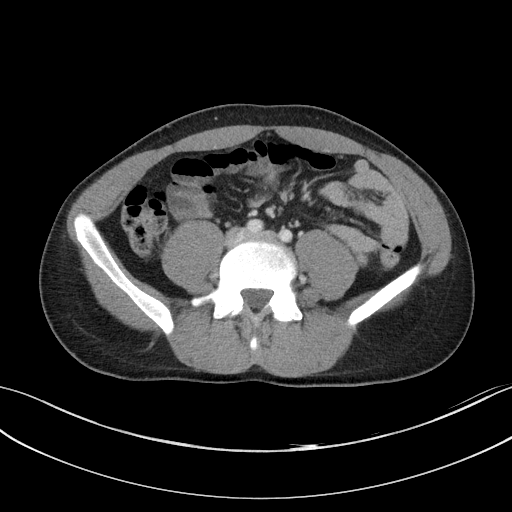
[im 52/92  soft-tissue]
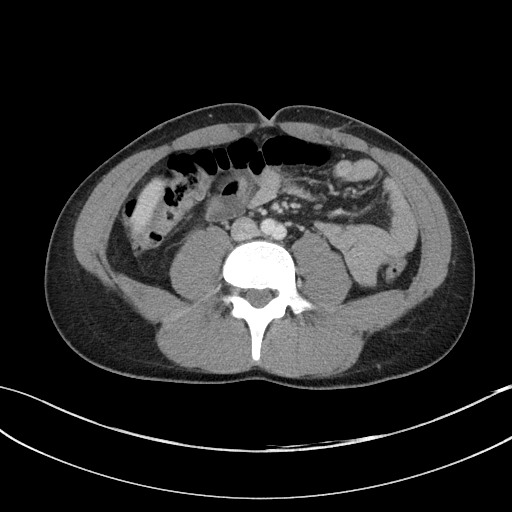
[im 60/92  soft-tissue]
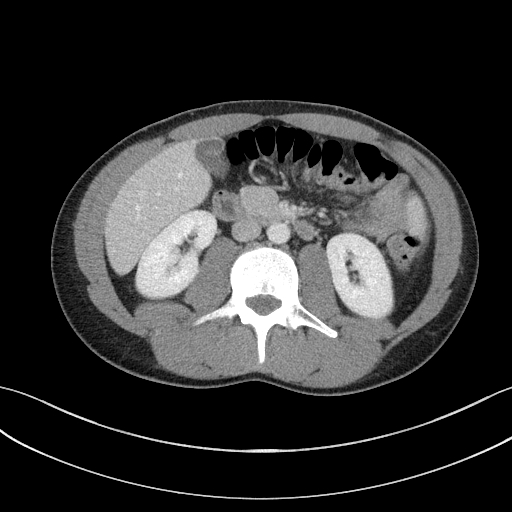
[im 60/92  bone]
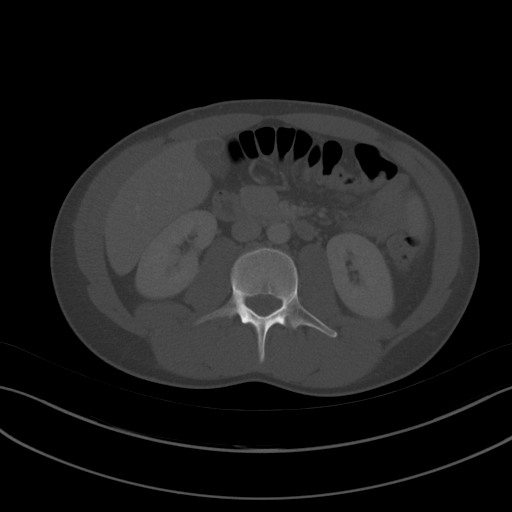
[im 68/92  soft-tissue]
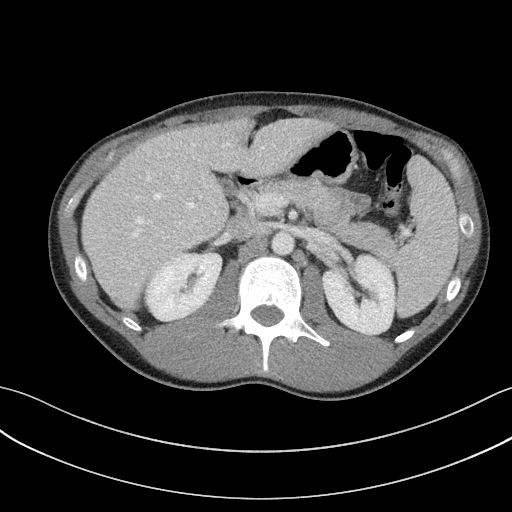
[im 72/92  soft-tissue]
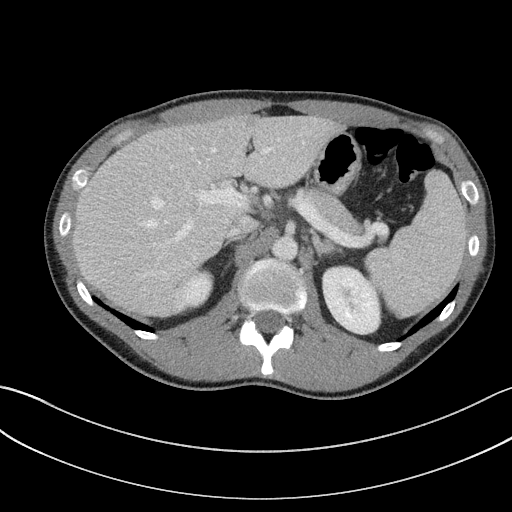
[im 80/92  soft-tissue]
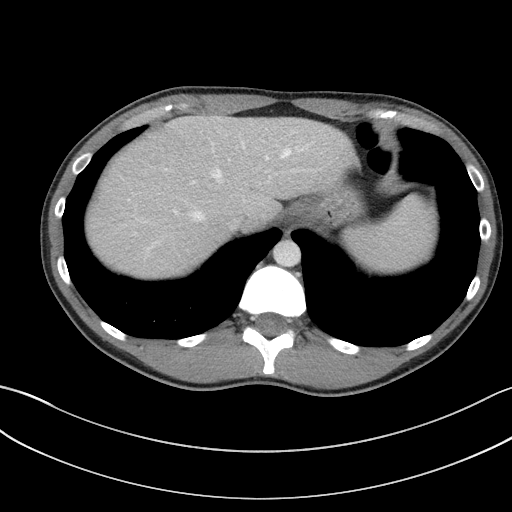
[im 88/92  soft-tissue]
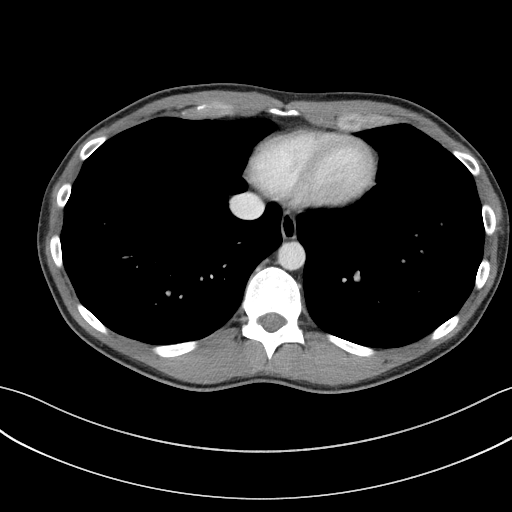

[Series 6: a/p w/ cor · coronal · 0.76mm/px · 3 of 151 slices shown]
[im 51/151  soft-tissue]
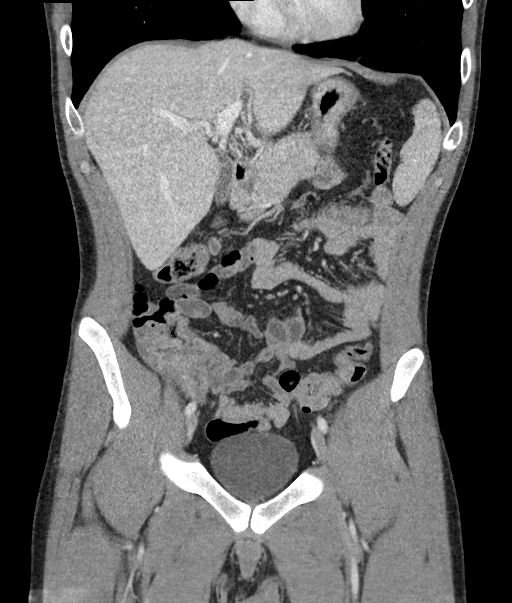
[im 67/151  soft-tissue]
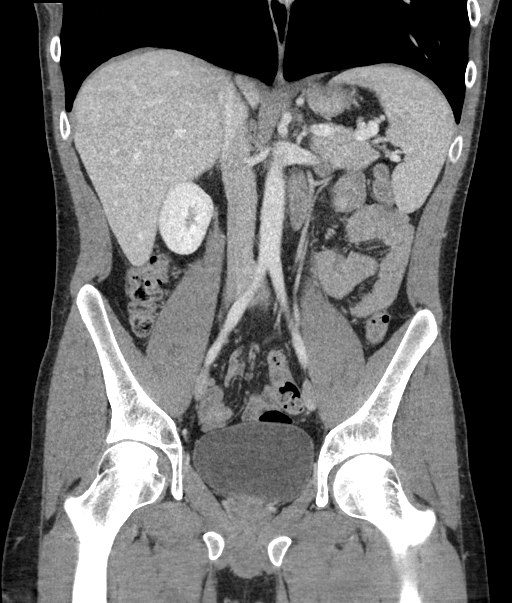
[im 84/151  soft-tissue]
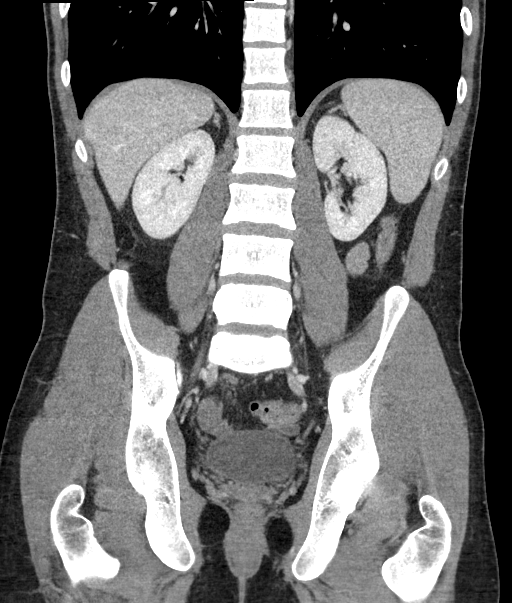

[16 of 46 positions shown; findings below may reference images not displayed]

FINDINGS: Lower chest: Subtle ground-glass opacities noted in the lung bases.
Heart normal in size. No pleural effusion

Hepatobiliary: No focal liver abnormality is seen. No gallstones,
gallbladder wall thickening, or biliary dilatation.

Pancreas: Unremarkable. No pancreatic ductal dilatation or
surrounding inflammatory changes.

Spleen: Normal in size without focal abnormality.

Adrenals/Urinary Tract: Adrenal glands are unremarkable. Kidneys are
normal, without renal calculi, focal lesion, or hydronephrosis.
Bladder is unremarkable.

Stomach/Bowel: Stomach is within normal limits. Appendix appears
normal. No evidence of bowel wall thickening, distention, or
inflammatory changes.

Vascular/Lymphatic: No significant vascular findings are present. No
enlarged abdominal or pelvic lymph nodes.

Reproductive: Unremarkable.

Other: No abdominal wall hernia or abnormality. No abdominopelvic
ascites.

Musculoskeletal: No acute or significant osseous findings.
IMPRESSION: 1. No abnormality within the abdomen or pelvis. Normal appendix
visualized. No findings to account for right lower quadrant
abdominal pain.
2. Subtle ground-glass opacities at the lung bases that seen
bilaterally and fairly symmetrically. This could reflect infection
or inflammation. It could reflect mosaic attenuation related to
airways disease. It is nonspecific.
